# Patient Record
Sex: Female | Born: 1965 | Hispanic: Yes | Smoking: Never smoker
Health system: Southern US, Community
[De-identification: ages and names within clinical notes are randomized; demographics above are authoritative.]

## PROBLEM LIST (undated history)

## (undated) DIAGNOSIS — J45909 Unspecified asthma, uncomplicated: Secondary | ICD-10-CM

## (undated) DIAGNOSIS — Z8742 Personal history of other diseases of the female genital tract: Secondary | ICD-10-CM

## (undated) DIAGNOSIS — K219 Gastro-esophageal reflux disease without esophagitis: Secondary | ICD-10-CM

## (undated) DIAGNOSIS — K5790 Diverticulosis of intestine, part unspecified, without perforation or abscess without bleeding: Secondary | ICD-10-CM

## (undated) HISTORY — DX: Personal history of other diseases of the female genital tract: Z87.42

## (undated) HISTORY — PX: CARPAL TUNNEL RELEASE: SHX101

## (undated) HISTORY — DX: Unspecified asthma, uncomplicated: J45.909

## (undated) HISTORY — DX: Gastro-esophageal reflux disease without esophagitis: K21.9

## (undated) HISTORY — DX: Diverticulosis of intestine, part unspecified, without perforation or abscess without bleeding: K57.90

---

## 2005-03-03 ENCOUNTER — Ambulatory Visit: Payer: Self-pay | Admitting: Internal Medicine

## 2006-04-27 ENCOUNTER — Ambulatory Visit: Payer: Self-pay | Admitting: Unknown Physician Specialty

## 2007-05-14 ENCOUNTER — Ambulatory Visit: Payer: Self-pay | Admitting: Unknown Physician Specialty

## 2009-10-05 ENCOUNTER — Ambulatory Visit: Payer: Self-pay | Admitting: Unknown Physician Specialty

## 2010-08-31 ENCOUNTER — Ambulatory Visit: Payer: Self-pay | Admitting: Internal Medicine

## 2010-09-01 ENCOUNTER — Inpatient Hospital Stay: Payer: Self-pay | Admitting: Surgery

## 2010-09-29 ENCOUNTER — Ambulatory Visit: Payer: Self-pay | Admitting: Surgery

## 2011-06-22 ENCOUNTER — Ambulatory Visit: Payer: Self-pay | Admitting: General Surgery

## 2013-07-19 ENCOUNTER — Ambulatory Visit: Payer: Self-pay | Admitting: Obstetrics and Gynecology

## 2015-08-21 ENCOUNTER — Other Ambulatory Visit: Payer: Self-pay | Admitting: Obstetrics and Gynecology

## 2015-08-21 ENCOUNTER — Inpatient Hospital Stay
Admission: RE | Admit: 2015-08-21 | Discharge: 2015-08-21 | Disposition: A | Discharge status: Home / Self Care | Payer: Self-pay | Source: Ambulatory Visit | Attending: *Deleted | Admitting: *Deleted

## 2015-08-21 ENCOUNTER — Other Ambulatory Visit: Payer: Self-pay | Admitting: *Deleted

## 2015-08-21 DIAGNOSIS — Z9289 Personal history of other medical treatment: Secondary | ICD-10-CM

## 2015-08-21 DIAGNOSIS — R928 Other abnormal and inconclusive findings on diagnostic imaging of breast: Secondary | ICD-10-CM

## 2015-09-11 ENCOUNTER — Ambulatory Visit
Admission: RE | Admit: 2015-09-11 | Discharge: 2015-09-11 | Disposition: A | Discharge status: Home / Self Care | Payer: Self-pay | Source: Ambulatory Visit | Attending: Obstetrics and Gynecology | Admitting: Obstetrics and Gynecology

## 2015-09-11 ENCOUNTER — Other Ambulatory Visit: Payer: Self-pay | Admitting: Obstetrics and Gynecology

## 2015-09-11 DIAGNOSIS — R928 Other abnormal and inconclusive findings on diagnostic imaging of breast: Secondary | ICD-10-CM

## 2015-09-11 DIAGNOSIS — N6001 Solitary cyst of right breast: Secondary | ICD-10-CM | POA: Insufficient documentation

## 2015-09-11 DIAGNOSIS — N63 Unspecified lump in breast: Secondary | ICD-10-CM | POA: Insufficient documentation

## 2015-11-24 ENCOUNTER — Other Ambulatory Visit: Payer: Self-pay | Admitting: Internal Medicine

## 2015-11-24 DIAGNOSIS — R1011 Right upper quadrant pain: Secondary | ICD-10-CM

## 2015-11-24 DIAGNOSIS — R1013 Epigastric pain: Secondary | ICD-10-CM

## 2015-12-01 ENCOUNTER — Ambulatory Visit
Admission: RE | Admit: 2015-12-01 | Discharge: 2015-12-01 | Disposition: A | Discharge status: Home / Self Care | Payer: Self-pay | Source: Ambulatory Visit | Attending: Internal Medicine | Admitting: Internal Medicine

## 2015-12-01 DIAGNOSIS — R1013 Epigastric pain: Secondary | ICD-10-CM

## 2015-12-01 DIAGNOSIS — R1011 Right upper quadrant pain: Secondary | ICD-10-CM

## 2016-08-22 ENCOUNTER — Other Ambulatory Visit: Payer: Self-pay | Admitting: Obstetrics and Gynecology

## 2016-08-22 DIAGNOSIS — Z1231 Encounter for screening mammogram for malignant neoplasm of breast: Secondary | ICD-10-CM

## 2016-09-14 ENCOUNTER — Other Ambulatory Visit: Payer: Self-pay | Admitting: Obstetrics and Gynecology

## 2016-09-14 ENCOUNTER — Ambulatory Visit
Admission: RE | Admit: 2016-09-14 | Discharge: 2016-09-14 | Disposition: A | Discharge status: Home / Self Care | Payer: BC Managed Care – PPO | Source: Ambulatory Visit | Attending: Obstetrics and Gynecology | Admitting: Obstetrics and Gynecology

## 2016-09-14 DIAGNOSIS — Z1231 Encounter for screening mammogram for malignant neoplasm of breast: Secondary | ICD-10-CM | POA: Diagnosis present

## 2017-08-17 ENCOUNTER — Other Ambulatory Visit: Payer: Self-pay | Admitting: Obstetrics and Gynecology

## 2017-10-18 ENCOUNTER — Encounter: Payer: Self-pay | Admitting: Obstetrics and Gynecology

## 2017-10-18 ENCOUNTER — Ambulatory Visit (INDEPENDENT_AMBULATORY_CARE_PROVIDER_SITE_OTHER): Payer: Self-pay | Admitting: Obstetrics and Gynecology

## 2017-10-18 VITALS — BP 114/78 | Ht 59.25 in | Wt 118.0 lb

## 2017-10-18 DIAGNOSIS — Z01419 Encounter for gynecological examination (general) (routine) without abnormal findings: Secondary | ICD-10-CM

## 2017-10-18 DIAGNOSIS — Z1231 Encounter for screening mammogram for malignant neoplasm of breast: Secondary | ICD-10-CM

## 2017-10-18 DIAGNOSIS — Z1239 Encounter for other screening for malignant neoplasm of breast: Secondary | ICD-10-CM

## 2017-10-18 NOTE — Patient Instructions (Addendum)
I value your feedback and entrusting us with your care. If you get a [facility] patient survey, I would appreciate you taking the time to let us know about your experience today. Thank you!  [facility]: [phone]    

## 2017-10-18 NOTE — Progress Notes (Signed)
PCP: Mickey Farberhies, David, MD   Chief Complaint  Patient presents with  . Gynecologic Exam    HPI:      Samantha Erickson is a 51 y.o. [phone] who LMP was Patient's last menstrual period was 10/09/2017 (exact date)., presents today for her annual examination.  Her menses are regular every 28-30 days, lasting 5 days, light flow..  Dysmenorrhea mild, occurring first 1-2 days of flow. She does not have intermenstrual bleeding.  She does have occas night sweats.  Sex activity: not sexually active. She does not have vaginal dryness.  Last Pap: August 17, 2015  Results were: no abnormalities /neg HPV DNA.  Hx of STDs: none  Last mammogram: September 14, 2016  Results were: normal--routine follow-up in 12 months There is no FH of breast cancer. There is no FH of ovarian cancer. The patient does do self-breast exams.  Colonoscopy: colonoscopy 5/18 without abnormalities. Repeat due after 10 years.   Tobacco use: The patient denies current or previous tobacco use. Alcohol use: none Exercise: not active  She does get adequate calcium and Vitamin D in her diet.  Labs with PCP. Has lost 15# with diet changes/health program.  Past Medical History:  Diagnosis Date  . Asthma   . Diverticulosis   . GERD (gastroesophageal reflux disease)   . History of abnormal cervical Pap smear     Past Surgical History:  Procedure Laterality Date  . CARPAL TUNNEL RELEASE      Family History  Problem Relation Age of Onset  . Osteoporosis Mother   . Hypertension Father     Social History   Socioeconomic History  . Marital status: Widowed    Spouse name: Not on file  . Number of children: Not on file  . Years of education: Not on file  . Highest education level: Not on file  Social Needs  . Financial resource strain: Not on file  . Food insecurity - worry: Not on file  . Food insecurity - inability: Not on file  . Transportation needs - medical: Not on file  . Transportation needs -  non-medical: Not on file  Occupational History  . Not on file  Tobacco Use  . Smoking status: Never Smoker  . Smokeless tobacco: Never Used  Substance and Sexual Activity  . Alcohol use: No    Frequency: Never  . Drug use: No  . Sexual activity: Not Currently    Birth control/protection: None  Other Topics Concern  . Not on file  Social History Narrative  . Not on file    Current Meds  Medication Sig  . beclomethasone (QVAR) 80 MCG/ACT inhaler Inhale into the lungs.  . fluticasone (FLONASE) 50 MCG/ACT nasal spray Place into the nose.  . loratadine (CLARITIN) 10 MG tablet Take by mouth.  Marland Kitchen. omeprazole (PRILOSEC) 20 MG capsule Take by mouth.      ROS:  Review of Systems  Constitutional: Negative for fatigue, fever and unexpected weight change.  Respiratory: Negative for cough, shortness of breath and wheezing.   Cardiovascular: Negative for chest pain, palpitations and leg swelling.  Gastrointestinal: Negative for blood in stool, constipation, diarrhea, nausea and vomiting.  Endocrine: Negative for cold intolerance, heat intolerance and polyuria.  Genitourinary: Negative for dyspareunia, dysuria, flank pain, frequency, genital sores, hematuria, menstrual problem, pelvic pain, urgency, vaginal bleeding, vaginal discharge and vaginal pain.  Musculoskeletal: Negative for back pain, joint swelling and myalgias.  Skin: Negative for rash.  Neurological: Negative for dizziness, syncope, light-headedness, numbness  and headaches.  Hematological: Negative for adenopathy.  Psychiatric/Behavioral: Negative for agitation, confusion, sleep disturbance and suicidal ideas. The patient is not nervous/anxious.      Objective: BP 114/78   Ht 4' 11.25" (1.505 m)   Wt 118 lb (53.5 kg)   LMP 10/09/2017 (Exact Date)   BMI 23.63 kg/m    Physical Exam  Constitutional: She is oriented to person, place, and time. She appears well-developed and well-nourished.  Genitourinary: Vagina normal  and uterus normal. There is no rash or tenderness on the right labia. There is no rash or tenderness on the left labia. No erythema or tenderness in the vagina. No vaginal discharge found. Right adnexum does not display mass and does not display tenderness. Left adnexum does not display mass and does not display tenderness. Cervix does not exhibit motion tenderness or polyp. Uterus is not enlarged or tender.  Neck: Normal range of motion. No thyromegaly present.  Cardiovascular: Normal rate, regular rhythm and normal heart sounds.  No murmur heard. Pulmonary/Chest: Effort normal and breath sounds normal. Right breast exhibits no mass, no nipple discharge, no skin change and no tenderness. Left breast exhibits no mass, no nipple discharge, no skin change and no tenderness.  Abdominal: Soft. There is no tenderness. There is no guarding.  Musculoskeletal: Normal range of motion.  Neurological: She is alert and oriented to person, place, and time. No cranial nerve deficit.  Psychiatric: She has a normal mood and affect. Her behavior is normal.  Vitals reviewed.   Assessment/Plan:  Encounter for annual routine gynecological examination  Screening for breast cancer - Pt to sched mammo - Plan: MM DIGITAL SCREENING BILATERAL        GYN counsel breast self exam, mammography screening, menopause, adequate intake of calcium and vitamin D, diet and exercise    F/U  Return in about 1 year (around 10/18/2018).  [name] B. Arbell Wycoff, PA-C 10/18/2017 10:54 AM

## 2017-11-08 ENCOUNTER — Ambulatory Visit
Admission: RE | Admit: 2017-11-08 | Discharge: 2017-11-08 | Disposition: A | Discharge status: Home / Self Care | Payer: Self-pay | Source: Ambulatory Visit | Attending: Obstetrics and Gynecology | Admitting: Obstetrics and Gynecology

## 2017-11-08 ENCOUNTER — Encounter: Payer: Self-pay | Admitting: Obstetrics and Gynecology

## 2017-11-08 DIAGNOSIS — Z1231 Encounter for screening mammogram for malignant neoplasm of breast: Secondary | ICD-10-CM | POA: Insufficient documentation

## 2017-11-08 DIAGNOSIS — Z1239 Encounter for other screening for malignant neoplasm of breast: Secondary | ICD-10-CM

## 2018-10-30 ENCOUNTER — Ambulatory Visit (INDEPENDENT_AMBULATORY_CARE_PROVIDER_SITE_OTHER): Payer: Self-pay | Admitting: Obstetrics and Gynecology

## 2018-10-30 ENCOUNTER — Other Ambulatory Visit (HOSPITAL_COMMUNITY)
Admission: RE | Admit: 2018-10-30 | Discharge: 2018-10-30 | Disposition: A | Discharge status: Home / Self Care | Payer: Self-pay | Source: Ambulatory Visit | Attending: Obstetrics and Gynecology | Admitting: Obstetrics and Gynecology

## 2018-10-30 ENCOUNTER — Encounter: Payer: Self-pay | Admitting: Obstetrics and Gynecology

## 2018-10-30 VITALS — BP 100/70 | Ht 59.0 in | Wt 114.0 lb

## 2018-10-30 DIAGNOSIS — Z1239 Encounter for other screening for malignant neoplasm of breast: Secondary | ICD-10-CM

## 2018-10-30 DIAGNOSIS — Z124 Encounter for screening for malignant neoplasm of cervix: Secondary | ICD-10-CM

## 2018-10-30 DIAGNOSIS — Z01419 Encounter for gynecological examination (general) (routine) without abnormal findings: Secondary | ICD-10-CM

## 2018-10-30 DIAGNOSIS — Z1151 Encounter for screening for human papillomavirus (HPV): Secondary | ICD-10-CM

## 2018-10-30 NOTE — Patient Instructions (Signed)
I value your feedback and entrusting us with your care. If you get a [facility] patient survey, I would appreciate you taking the time to let us know about your experience today. Thank you! 

## 2018-10-30 NOTE — Progress Notes (Signed)
PCP: Mickey Farber, MD   Chief Complaint  Patient presents with  . Gynecologic Exam    HPI:      Ms. Samantha Erickson is a 53 y.o. [phone] who LMP was Patient's last menstrual period was 10/01/2018 (approximate)., presents today for her annual examination.  Her menses are regular every 28-30 days, lasting 4 days, light flow.  Dysmenorrhea mild, occurring first 1-2 days of flow. She does not have intermenstrual bleeding.  She does not have vasomotor sx.  Sex activity: not sexually active. She does not have vaginal dryness.  Last Pap: August 17, 2015  Results were: no abnormalities /neg HPV DNA.  Hx of STDs: none  Last mammogram: 11/08/17  Results were: normal--routine follow-up in 12 months There is no FH of breast cancer. There is no FH of ovarian cancer. The patient does do self-breast exams.  Colonoscopy: colonoscopy 5/18 without abnormalities. Repeat due after 10 years.   Tobacco use: The patient denies current or previous tobacco use. Alcohol use: none Exercise: not active  She does get adequate calcium and Vitamin D in her diet.  Labs with PCP.   Past Medical History:  Diagnosis Date  . Asthma   . Diverticulosis   . GERD (gastroesophageal reflux disease)   . History of abnormal cervical Pap smear     Past Surgical History:  Procedure Laterality Date  . CARPAL TUNNEL RELEASE      Family History  Problem Relation Age of Onset  . Osteoporosis Mother   . Hypertension Father   . Breast cancer Neg Hx     Social History   Socioeconomic History  . Marital status: Widowed    Spouse name: Not on file  . Number of children: Not on file  . Years of education: Not on file  . Highest education level: Not on file  Occupational History  . Not on file  Social Needs  . Financial resource strain: Not on file  . Food insecurity:    Worry: Not on file    Inability: Not on file  . Transportation needs:    Medical: Not on file    Non-medical: Not on file  Tobacco  Use  . Smoking status: Never Smoker  . Smokeless tobacco: Never Used  Substance and Sexual Activity  . Alcohol use: No    Frequency: Never  . Drug use: No  . Sexual activity: Not Currently    Birth control/protection: None  Lifestyle  . Physical activity:    Days per week: 0 days    Minutes per session: 0 min  . Stress: Not at all  Relationships  . Social connections:    Talks on phone: More than three times a week    Gets together: Twice a week    Attends religious service: More than 4 times per year    Active member of club or organization: No    Attends meetings of clubs or organizations: Never    Relationship status: Widowed  . Intimate partner violence:    Fear of current or ex partner: No    Emotionally abused: No    Physically abused: No    Forced sexual activity: No  Other Topics Concern  . Not on file  Social History Narrative  . Not on file    Current Meds  Medication Sig  . beclomethasone (QVAR) 80 MCG/ACT inhaler Inhale into the lungs.  . fluticasone (FLONASE) 50 MCG/ACT nasal spray Place into the nose.  . loratadine (CLARITIN) 10 MG  tablet Take by mouth.  . MULTIPLE VITAMIN PO Take by mouth.  Marland Kitchen omeprazole (PRILOSEC) 20 MG capsule Take by mouth.  . Probiotic Product (ACIDOPHILUS PROBIOTIC BLEND PO) Take by mouth.  Marland Kitchen UNABLE TO FIND Take by mouth.      ROS:  Review of Systems  Constitutional: Negative for fatigue, fever and unexpected weight change.  Respiratory: Negative for cough, shortness of breath and wheezing.   Cardiovascular: Negative for chest pain, palpitations and leg swelling.  Gastrointestinal: Negative for blood in stool, constipation, diarrhea, nausea and vomiting.  Endocrine: Negative for cold intolerance, heat intolerance and polyuria.  Genitourinary: Negative for dyspareunia, dysuria, flank pain, frequency, genital sores, hematuria, menstrual problem, pelvic pain, urgency, vaginal bleeding, vaginal discharge and vaginal pain.    Musculoskeletal: Negative for back pain, joint swelling and myalgias.  Skin: Negative for rash.  Neurological: Negative for dizziness, syncope, light-headedness, numbness and headaches.  Hematological: Negative for adenopathy.  Psychiatric/Behavioral: Negative for agitation, confusion, sleep disturbance and suicidal ideas. The patient is not nervous/anxious.      Objective: BP 100/70   Ht 4\' 11"  (1.499 m)   Wt 114 lb (51.7 kg)   LMP 10/01/2018 (Approximate)   BMI 23.03 kg/m    Physical Exam Constitutional:      Appearance: She is well-developed.  Genitourinary:     Vulva, vagina, uterus, right adnexa and left adnexa normal.     No vulval lesion or tenderness noted.     No vaginal discharge, erythema or tenderness.     No cervical motion tenderness or polyp.     Uterus is not enlarged or tender.     No right or left adnexal mass present.     Right adnexa not tender.     Left adnexa not tender.  Neck:     Musculoskeletal: Normal range of motion.     Thyroid: No thyromegaly.  Cardiovascular:     Rate and Rhythm: Normal rate and regular rhythm.     Heart sounds: Normal heart sounds. No murmur.  Pulmonary:     Effort: Pulmonary effort is normal.     Breath sounds: Normal breath sounds.  Chest:     Breasts:        Right: No mass, nipple discharge, skin change or tenderness.        Left: No mass, nipple discharge, skin change or tenderness.  Abdominal:     Palpations: Abdomen is soft.     Tenderness: There is no abdominal tenderness. There is no guarding.  Musculoskeletal: Normal range of motion.  Neurological:     Mental Status: She is alert and oriented to person, place, and time.     Cranial Nerves: No cranial nerve deficit.  Psychiatric:        Behavior: Behavior normal.  Vitals signs reviewed.     Assessment/Plan:  Encounter for annual routine gynecological examination  Cervical cancer screening - Plan: Cytology - PAP  Screening for HPV (human  papillomavirus) - Plan: Cytology - PAP  Screening for breast cancer - Pt to sched mammo - Plan: MM 3D SCREEN BREAST BILATERAL        GYN counsel breast self exam, mammography screening, menopause, adequate intake of calcium and vitamin D, diet and exercise    F/U  Return in about 1 year (around 10/31/2019).  Alicia B. Copland, PA-C 10/30/2018 10:48 AM

## 2018-11-01 LAB — CYTOLOGY - PAP
Diagnosis: NEGATIVE
HPV (WINDOPATH): NOT DETECTED

## 2020-03-19 ENCOUNTER — Other Ambulatory Visit: Payer: Self-pay | Admitting: Pediatrics

## 2020-03-19 DIAGNOSIS — Z1231 Encounter for screening mammogram for malignant neoplasm of breast: Secondary | ICD-10-CM

## 2020-03-24 ENCOUNTER — Encounter (INDEPENDENT_AMBULATORY_CARE_PROVIDER_SITE_OTHER): Payer: Self-pay

## 2020-03-24 ENCOUNTER — Ambulatory Visit
Admission: RE | Admit: 2020-03-24 | Discharge: 2020-03-24 | Disposition: A | Discharge status: Home / Self Care | Payer: Self-pay | Source: Ambulatory Visit | Attending: Pediatrics | Admitting: Pediatrics

## 2020-03-24 ENCOUNTER — Other Ambulatory Visit: Payer: Self-pay

## 2020-03-24 DIAGNOSIS — Z1231 Encounter for screening mammogram for malignant neoplasm of breast: Secondary | ICD-10-CM | POA: Insufficient documentation

## 2020-03-26 ENCOUNTER — Other Ambulatory Visit: Payer: Self-pay | Admitting: Pediatrics

## 2020-03-26 DIAGNOSIS — R928 Other abnormal and inconclusive findings on diagnostic imaging of breast: Secondary | ICD-10-CM

## 2020-03-26 DIAGNOSIS — N6320 Unspecified lump in the left breast, unspecified quadrant: Secondary | ICD-10-CM

## 2020-03-26 DIAGNOSIS — N6310 Unspecified lump in the right breast, unspecified quadrant: Secondary | ICD-10-CM

## 2020-04-01 ENCOUNTER — Ambulatory Visit
Admission: RE | Admit: 2020-04-01 | Discharge: 2020-04-01 | Disposition: A | Discharge status: Home / Self Care | Payer: Self-pay | Source: Ambulatory Visit | Attending: Pediatrics | Admitting: Pediatrics

## 2020-04-01 DIAGNOSIS — N6310 Unspecified lump in the right breast, unspecified quadrant: Secondary | ICD-10-CM

## 2020-04-01 DIAGNOSIS — N6320 Unspecified lump in the left breast, unspecified quadrant: Secondary | ICD-10-CM

## 2020-04-01 DIAGNOSIS — R928 Other abnormal and inconclusive findings on diagnostic imaging of breast: Secondary | ICD-10-CM

## 2020-07-07 ENCOUNTER — Telehealth: Payer: Self-pay

## 2020-07-07 NOTE — Telephone Encounter (Signed)
Spoke w/patient. Inquired about pain. She reports the pain lasts about 2 days. She is able to control it with Ibuprofen. She states it makes her feel really bad and gives her problems with her stomach (bloated). She has acid reflux. She has discussed with her primary. She had colonoscopy and endoscopy. Primary recommended her to mention to GYN at her next AE to order u/s to see what may be causing this. Her bleeding is normal (not heavy/irregular)

## 2020-07-07 NOTE — Telephone Encounter (Signed)
Patient requesting [facility] to order an u/s for her. She has been having heavy lower abdominal pain (in the area of ovaries and uterus) before and after her period. Cb#[phone]

## 2020-07-07 NOTE — Telephone Encounter (Signed)
Pt aware, transferred to [facility] to schedule appt.

## 2020-07-07 NOTE — Telephone Encounter (Signed)
Haven't seen pt since 1/20. She needs appt first and can then sched u/s.

## 2020-07-08 NOTE — Progress Notes (Signed)
Orene Desanctis, MD   Chief Complaint  Patient presents with  . Pelvic Pain    before and after cycle, entire pelvic area x 1 yr    HPI:      Ms. Samantha Erickson is a 54 y.o. [phone] whose LMP was Patient's last menstrual period was 06/28/2020 (approximate)., presents today for pelvic pain/dysmen before and after her menses, for the past yr. Menses are monthly, last 3-4 days, light flow, no BTB. Has dysmen, as well as fatigue/lethargy and bloating with LBP 5-6 days before her period and for 1 day during her period. Then has some cramping a few days after her period ends. Takes tylenol/NSAIDs with some improvement of cramps. No urin or vag sx. Has some loose stools sometimes with menses, but also keeping stools soft due to hx of diverticulosis. Pt's mom and 2 sisters s/p hyst. Has 1 sister who is 83 and still having periods. No vasomotor sx. Pt is not sex active. No non-menstrual cramping/pain.   She has acid reflux and has discussed dysmen with her primary. She had colonoscopy and endoscopy. Primary recommended her to mention to GYN at her next AE to order u/s.  Last pap neg/neg HPV DNA 10/30/18  PT IS SELF PAY  Past Medical History:  Diagnosis Date  . Asthma   . Diverticulosis   . GERD (gastroesophageal reflux disease)   . History of abnormal cervical Pap smear     Past Surgical History:  Procedure Laterality Date  . CARPAL TUNNEL RELEASE      Family History  Problem Relation Age of Onset  . Osteoporosis Mother   . Hypertension Father   . Breast cancer Maternal Aunt 1    Social History   Socioeconomic History  . Marital status: Widowed    Spouse name: Not on file  . Number of children: Not on file  . Years of education: Not on file  . Highest education level: Not on file  Occupational History  . Not on file  Tobacco Use  . Smoking status: Never Smoker  . Smokeless tobacco: Never Used  Vaping Use  . Vaping Use: Never used  Substance and Sexual Activity  .  Alcohol use: No  . Drug use: No  . Sexual activity: Not Currently    Birth control/protection: None  Other Topics Concern  . Not on file  Social History Narrative  . Not on file   Social Determinants of Health   Financial Resource Strain:   . Difficulty of Paying Living Expenses: Not on file  Food Insecurity:   . Worried About Programme researcher, broadcasting/film/video in the Last Year: Not on file  . Ran Out of Food in the Last Year: Not on file  Transportation Needs:   . Lack of Transportation (Medical): Not on file  . Lack of Transportation (Non-Medical): Not on file  Physical Activity:   . Days of Exercise per Week: Not on file  . Minutes of Exercise per Session: Not on file  Stress:   . Feeling of Stress : Not on file  Social Connections:   . Frequency of Communication with Friends and Family: Not on file  . Frequency of Social Gatherings with Friends and Family: Not on file  . Attends Religious Services: Not on file  . Active Member of Clubs or Organizations: Not on file  . Attends Banker Meetings: Not on file  . Marital Status: Not on file  Intimate Partner Violence:   . Fear  of Current or Ex-Partner: Not on file  . Emotionally Abused: Not on file  . Physically Abused: Not on file  . Sexually Abused: Not on file    Outpatient Medications Prior to Visit  Medication Sig Dispense Refill  . albuterol (VENTOLIN HFA) 108 (90 Base) MCG/ACT inhaler Inhale into the lungs.    . beclomethasone (QVAR) 80 MCG/ACT inhaler Inhale into the lungs.    . Cholecalciferol 50 MCG (2000 UT) TABS Take by mouth.    . fluticasone (FLONASE) 50 MCG/ACT nasal spray Place into the nose.    . loratadine (CLARITIN) 10 MG tablet Take by mouth.    . MULTIPLE VITAMIN PO Take by mouth.    Marland Kitchen omeprazole (PRILOSEC) 40 MG capsule Take by mouth.    . Probiotic Product (ACIDOPHILUS PROBIOTIC BLEND PO) Take by mouth.    Marland Kitchen omeprazole (PRILOSEC) 20 MG capsule Take by mouth.    Marland Kitchen UNABLE TO FIND Take by mouth.      No facility-administered medications prior to visit.      ROS:  Review of Systems  Constitutional: Negative for fever.  Gastrointestinal: Negative for blood in stool, constipation, diarrhea, nausea and vomiting.  Genitourinary: Positive for pelvic pain. Negative for dyspareunia, dysuria, flank pain, frequency, hematuria, urgency, vaginal bleeding, vaginal discharge and vaginal pain.  Musculoskeletal: Negative for back pain.  Skin: Negative for rash.   BREAST: No symptoms   OBJECTIVE:   Vitals:  BP 100/70   Ht 4\' 11"  (1.499 m)   Wt 121 lb (54.9 kg)   LMP 06/28/2020 (Approximate)   BMI 24.44 kg/m   Physical Exam Vitals reviewed.  Constitutional:      Appearance: She is well-developed.  Pulmonary:     Effort: Pulmonary effort is normal.  Abdominal:     Palpations: Abdomen is soft.     Tenderness: There is no abdominal tenderness. There is no guarding or rebound.  Genitourinary:    General: Normal vulva.     Pubic Area: No rash.      Labia:        Right: No rash, tenderness or lesion.        Left: No rash, tenderness or lesion.      Vagina: Normal. No vaginal discharge, erythema or tenderness.     Cervix: Normal.     Uterus: Normal. Not enlarged and not tender.      Adnexa: Right adnexa normal and left adnexa normal.       Right: No mass or tenderness.         Left: No mass or tenderness.    Musculoskeletal:        General: Normal range of motion.     Cervical back: Normal range of motion.  Skin:    General: Skin is warm and dry.  Neurological:     General: No focal deficit present.     Mental Status: She is alert and oriented to person, place, and time.  Psychiatric:        Mood and Affect: Mood normal.        Behavior: Behavior normal.        Thought Content: Thought content normal.        Judgment: Judgment normal.     Assessment/Plan: Pelvic pain - Plan: 08/28/2020 PELVIS TRANSVAGINAL NON-OB (TV ONLY); Neg exam. Check GYN u/s. Will f/u with results. If  neg, reassurance re: normal cramping with menses. Can also try heating pad. F/u prn AUB.  Dysmenorrhea - Plan: US PELVIS TRANSVAGINAL  NON-OB (TV ONLY); discussed normal fatigue/letharge, bloating before menses, particularly due to hormonal changes.      Return in about 1 day (around 07/10/2020) for GYN u/s for pelvic pain--[facility] to call.  Dream Nodal B. Korby Ratay, PA-C 07/09/2020 10:57 AM

## 2020-07-09 ENCOUNTER — Ambulatory Visit (INDEPENDENT_AMBULATORY_CARE_PROVIDER_SITE_OTHER): Payer: Self-pay | Admitting: Obstetrics and Gynecology

## 2020-07-09 ENCOUNTER — Other Ambulatory Visit: Payer: Self-pay

## 2020-07-09 ENCOUNTER — Encounter: Payer: Self-pay | Admitting: Obstetrics and Gynecology

## 2020-07-09 VITALS — BP 100/70 | Ht 59.0 in | Wt 121.0 lb

## 2020-07-09 DIAGNOSIS — R102 Pelvic and perineal pain: Secondary | ICD-10-CM

## 2020-07-09 DIAGNOSIS — N946 Dysmenorrhea, unspecified: Secondary | ICD-10-CM

## 2020-07-09 NOTE — Patient Instructions (Signed)
I value your feedback and entrusting us with your care. If you get a [facility] patient survey, I would appreciate you taking the time to let us know about your experience today. Thank you!  As of October 03, 2019, your lab results will be released to your MyChart immediately, before I even have a chance to see them. Please give me time to review them and contact you if there are any abnormalities. Thank you for your patience.  

## 2020-07-13 ENCOUNTER — Other Ambulatory Visit: Payer: Self-pay

## 2020-07-13 ENCOUNTER — Ambulatory Visit (INDEPENDENT_AMBULATORY_CARE_PROVIDER_SITE_OTHER): Payer: Self-pay

## 2020-07-13 ENCOUNTER — Telehealth: Payer: Self-pay | Admitting: Obstetrics and Gynecology

## 2020-07-13 DIAGNOSIS — N946 Dysmenorrhea, unspecified: Secondary | ICD-10-CM

## 2020-07-13 DIAGNOSIS — R102 Pelvic and perineal pain: Secondary | ICD-10-CM

## 2020-07-13 NOTE — Telephone Encounter (Signed)
LM for pt with normal GYN u/s results. Reassurance re: dysmen with menses. F/u prn.

## 2021-07-06 ENCOUNTER — Other Ambulatory Visit: Payer: Self-pay | Admitting: Pediatrics

## 2021-08-06 ENCOUNTER — Other Ambulatory Visit: Payer: Self-pay | Admitting: Pediatrics

## 2021-08-06 DIAGNOSIS — Z1231 Encounter for screening mammogram for malignant neoplasm of breast: Secondary | ICD-10-CM

## 2021-08-12 ENCOUNTER — Other Ambulatory Visit: Payer: Self-pay | Admitting: Pediatrics

## 2021-08-12 ENCOUNTER — Other Ambulatory Visit (HOSPITAL_COMMUNITY): Payer: Self-pay | Admitting: Pediatrics

## 2021-08-12 DIAGNOSIS — R1032 Left lower quadrant pain: Secondary | ICD-10-CM

## 2021-08-30 ENCOUNTER — Other Ambulatory Visit: Payer: Self-pay

## 2021-08-30 ENCOUNTER — Ambulatory Visit
Admission: RE | Admit: 2021-08-30 | Discharge: 2021-08-30 | Disposition: A | Discharge status: Home / Self Care | Payer: Self-pay | Source: Ambulatory Visit | Attending: Pediatrics | Admitting: Pediatrics

## 2021-08-30 DIAGNOSIS — Z1231 Encounter for screening mammogram for malignant neoplasm of breast: Secondary | ICD-10-CM | POA: Insufficient documentation

## 2021-08-31 ENCOUNTER — Ambulatory Visit
Admission: RE | Admit: 2021-08-31 | Discharge: 2021-08-31 | Disposition: A | Discharge status: Home / Self Care | Payer: Self-pay | Source: Ambulatory Visit | Attending: Pediatrics | Admitting: Pediatrics

## 2021-08-31 DIAGNOSIS — R1032 Left lower quadrant pain: Secondary | ICD-10-CM | POA: Insufficient documentation

## 2021-08-31 MED ORDER — IOHEXOL 300 MG/ML  SOLN
100.0000 mL | Freq: Once | INTRAMUSCULAR | Status: AC | PRN
  Administered 2021-08-31: 100 mL via INTRAVENOUS

## 2021-09-02 ENCOUNTER — Other Ambulatory Visit: Payer: Self-pay | Admitting: Pediatrics

## 2021-09-02 DIAGNOSIS — N6489 Other specified disorders of breast: Secondary | ICD-10-CM

## 2021-09-02 DIAGNOSIS — R928 Other abnormal and inconclusive findings on diagnostic imaging of breast: Secondary | ICD-10-CM

## 2021-09-15 ENCOUNTER — Ambulatory Visit
Admission: RE | Admit: 2021-09-15 | Discharge: 2021-09-15 | Disposition: A | Discharge status: Home / Self Care | Payer: Self-pay | Source: Ambulatory Visit | Attending: Pediatrics | Admitting: Pediatrics

## 2021-09-15 ENCOUNTER — Other Ambulatory Visit: Payer: Self-pay

## 2021-09-15 DIAGNOSIS — R928 Other abnormal and inconclusive findings on diagnostic imaging of breast: Secondary | ICD-10-CM | POA: Insufficient documentation

## 2021-09-15 DIAGNOSIS — N6489 Other specified disorders of breast: Secondary | ICD-10-CM

## 2022-02-01 ENCOUNTER — Other Ambulatory Visit: Payer: Self-pay | Admitting: Pediatrics

## 2022-02-01 DIAGNOSIS — N6489 Other specified disorders of breast: Secondary | ICD-10-CM

## 2022-02-21 ENCOUNTER — Ambulatory Visit
Admission: RE | Admit: 2022-02-21 | Discharge: 2022-02-21 | Disposition: A | Discharge status: Home / Self Care | Payer: Self-pay | Source: Ambulatory Visit | Attending: Pediatrics | Admitting: Pediatrics

## 2022-02-21 DIAGNOSIS — N6489 Other specified disorders of breast: Secondary | ICD-10-CM | POA: Insufficient documentation

## 2022-06-06 ENCOUNTER — Ambulatory Visit (INDEPENDENT_AMBULATORY_CARE_PROVIDER_SITE_OTHER): Payer: Self-pay | Admitting: Obstetrics & Gynecology

## 2022-06-06 ENCOUNTER — Other Ambulatory Visit (HOSPITAL_COMMUNITY)
Admission: RE | Admit: 2022-06-06 | Discharge: 2022-06-06 | Disposition: A | Discharge status: Home / Self Care | Payer: Self-pay | Source: Ambulatory Visit | Attending: Obstetrics & Gynecology | Admitting: Obstetrics & Gynecology

## 2022-06-06 ENCOUNTER — Encounter: Payer: Self-pay | Admitting: Obstetrics & Gynecology

## 2022-06-06 VITALS — BP 120/80 | Ht 59.0 in | Wt 119.0 lb

## 2022-06-06 DIAGNOSIS — N9089 Other specified noninflammatory disorders of vulva and perineum: Secondary | ICD-10-CM

## 2022-06-06 DIAGNOSIS — N6321 Unspecified lump in the left breast, upper outer quadrant: Secondary | ICD-10-CM

## 2022-06-06 DIAGNOSIS — Z124 Encounter for screening for malignant neoplasm of cervix: Secondary | ICD-10-CM | POA: Insufficient documentation

## 2022-06-06 DIAGNOSIS — Z803 Family history of malignant neoplasm of breast: Secondary | ICD-10-CM

## 2022-06-06 DIAGNOSIS — Z01419 Encounter for gynecological examination (general) (routine) without abnormal findings: Secondary | ICD-10-CM

## 2022-06-06 DIAGNOSIS — N6320 Unspecified lump in the left breast, unspecified quadrant: Secondary | ICD-10-CM | POA: Insufficient documentation

## 2022-06-06 DIAGNOSIS — N926 Irregular menstruation, unspecified: Secondary | ICD-10-CM

## 2022-06-06 NOTE — Progress Notes (Signed)
Subjective:    Samantha Erickson is a 56 y.o. widowed Hispanic P3 (who presents for an annual exam. She was last seen here in 2021. She reports that her periods have been coming every 2 weeks for the last few months. She has an app and has been tracking them for several months.The patient is not currently sexually active. GYN screening history: last pap: was normal. The patient wears seatbelts: yes. The patient participates in regular exercise: yes. (Walking) Has the patient ever been transfused or tattooed?: no. The patient reports that there is not domestic violence in her life.   Menstrual History: OB History     Gravida  4   Para  3   Term      Preterm      AB  1   Living  3      SAB  1   IAB      Ectopic      Multiple      Live Births  3            Patient's last menstrual period was 05/15/2022. Period Pattern: (!) Irregular  The following portions of the patient's history were reviewed and updated as appropriate: allergies, current medications, past family history, past medical history, past social history, past surgical history, and problem list.  Review of Systems Pertinent items are noted in HPI.  FH- breast cancer in her mom and maternal aunt. She denies FH of gyn or colon cancers. She is being followed with short term imaging for a breast lump in left breast. Her mom went through menopause around 57 years old. She was born in the [place]. 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   Objective:    BP 120/80   Ht '4\' 11"'  (1.499 m)   Wt 119 lb (54 kg)   LMP 05/15/2022   BMI 24.04 kg/m   General Appearance:    Alert, cooperative, no distress, appears stated age  Head:    Normocephalic, without obvious abnormality, atraumatic  Eyes:    PERRL, conjunctiva/corneas clear, EOM's intact, fundi    benign, both eyes  Ears:    Normal TM's and external ear canals, both ears  Nose:   Nares normal, septum midline, mucosa normal, no drainage    or sinus  tenderness  Throat:   Lips, mucosa, and tongue normal; teeth and gums normal  Neck:   Supple, symmetrical, trachea midline, no adenopathy;    thyroid:  no enlargement/tenderness/nodules; no carotid   bruit or JVD  Back:     Symmetric, no curvature, ROM normal, no CVA tenderness  Lungs:     Clear to auscultation bilaterally, respirations unlabored  Chest Wall:    No tenderness or deformity   Heart:    Regular rate and rhythm, S1 and S2 normal, no murmur, rub   or gallop  Breast Exam:    3 cm mobile distinct left breast mass in the upper outer quadrant  Abdomen:     Soft, non-tender, bowel sounds active all four quadrants,    no masses, no organomegaly  Genitalia:    Some brown discoloration of the vulva, not necessarily abnormal but there is a small area in the left posterior fourchette that is more reddened (see photo under media) Normal size and shape, retroverted uterus, normal adnexal exam     Extremities:   Extremities normal, atraumatic, no cyanosis or edema  Pulses:   2+ and symmetric all extremities  Skin:  Skin color, texture, turgor normal, no rashes or lesions  Lymph nodes:   Cervical, supraclavicular, and axillary nodes normal  Neurologic:   CNII-XII intact, normal strength, sensation and reflexes    throughout  .    Assessment:    Healthy female exam.  Irregular (perimenopausal) bleeding/periods Breast cancer in her mom and maternal aunt Left breast mass Vulvar skin changes   Plan:     Await pap smear results.  Check TSH, T4, and gyn ultrasound [facility] testing ordered Gen surg consult regarding breast Come back for vulvar biopsy

## 2022-06-08 LAB — CYTOLOGY - PAP
Comment: NEGATIVE
Diagnosis: UNDETERMINED — AB
High risk HPV: NEGATIVE

## 2022-06-11 ENCOUNTER — Encounter: Payer: Self-pay | Admitting: Obstetrics & Gynecology

## 2022-06-16 ENCOUNTER — Ambulatory Visit
Admission: RE | Admit: 2022-06-16 | Discharge: 2022-06-16 | Disposition: A | Discharge status: Home / Self Care | Payer: Self-pay | Source: Ambulatory Visit | Attending: Obstetrics & Gynecology | Admitting: Obstetrics & Gynecology

## 2022-06-16 ENCOUNTER — Telehealth: Payer: Self-pay

## 2022-06-16 DIAGNOSIS — N926 Irregular menstruation, unspecified: Secondary | ICD-10-CM | POA: Insufficient documentation

## 2022-06-16 NOTE — Telephone Encounter (Signed)
Pt called needing a referral for [facility] for a left breast biopsy. Pt wants to see Dr Manson Passey. She states that Dr Marice Potter sent her to a surgeon but the surgeon cancelled the appointment because she will need to bx first.

## 2022-06-17 ENCOUNTER — Ambulatory Visit: Payer: Self-pay | Admitting: Surgery

## 2022-06-20 ENCOUNTER — Ambulatory Visit (INDEPENDENT_AMBULATORY_CARE_PROVIDER_SITE_OTHER): Payer: Self-pay | Admitting: Obstetrics & Gynecology

## 2022-06-20 ENCOUNTER — Encounter: Payer: Self-pay | Admitting: Obstetrics & Gynecology

## 2022-06-20 ENCOUNTER — Other Ambulatory Visit (HOSPITAL_COMMUNITY)
Admission: RE | Admit: 2022-06-20 | Discharge: 2022-06-20 | Disposition: A | Discharge status: Home / Self Care | Payer: Self-pay | Source: Ambulatory Visit | Attending: Obstetrics & Gynecology | Admitting: Obstetrics & Gynecology

## 2022-06-20 ENCOUNTER — Other Ambulatory Visit (HOSPITAL_COMMUNITY)
Admission: RE | Admit: 2022-06-20 | Discharge: 2022-06-20 | Disposition: A | Discharge status: Home / Self Care | Payer: Self-pay | Source: Ambulatory Visit

## 2022-06-20 VITALS — BP 116/84 | Ht 59.0 in | Wt 117.6 lb

## 2022-06-20 DIAGNOSIS — N9089 Other specified noninflammatory disorders of vulva and perineum: Secondary | ICD-10-CM

## 2022-06-20 DIAGNOSIS — N926 Irregular menstruation, unspecified: Secondary | ICD-10-CM

## 2022-06-20 DIAGNOSIS — N898 Other specified noninflammatory disorders of vagina: Secondary | ICD-10-CM

## 2022-06-20 DIAGNOSIS — N9489 Other specified conditions associated with female genital organs and menstrual cycle: Secondary | ICD-10-CM | POA: Insufficient documentation

## 2022-06-20 NOTE — Addendum Note (Signed)
Addended by: Nicholaus Bloom C on: 06/20/2022 10:30 AM   Modules accepted: Orders

## 2022-06-20 NOTE — Progress Notes (Addendum)
   Subjective:    Patient ID: Samantha Erickson, female    DOB: 1966-07-13, 56 y.o.   MRN: [id number]  HPI 55 yo widowed P3 here for [facility] and vulvar biopsies. She was seen 06/06/2022 with irregular periods. An ultrasound showed a 6 mm endometrium and a possible echogenic mass.  During the exam, I saw an area of irregular redness of her vulva.  She is abstinent.  Today she mentions a vaginal itching for the last few days.   Review of Systems Her pap showed ASCUS with negative HPV 06/16/2022    Objective:   Physical Exam Well nourished, well hydrated Hispanic female, no apparent distress She is ambulating and conversing normally. The reddened area of her posterior fourchette is no longer red, so she does not need the vuvlar biopsy    UPT negative, consent signed, time out done Cervix prepped with betadine and sprayed with Hurricaine spray. I then grasped with a single tooth tenaculum. Uterus sounded to 9 cm Pipelle used for 2 passes with a very large amount of tissue obtained. She tolerated the procedure well.       Assessment & Plan:  1) Left breast mass- she is being referred for a biopsy  2) Vaginal itching- Aptima testing sent  3) Possible endometrial mass- await EMBX results  4) ASCUS pap with negative HR HPV- repeat pap in 1 year  I will notify her of the pathology results via Mychart.  5) Irregular bleeding at 56 years of age-  check FSH and TSH

## 2022-06-21 ENCOUNTER — Encounter: Payer: Self-pay | Admitting: Obstetrics & Gynecology

## 2022-06-21 ENCOUNTER — Other Ambulatory Visit (INDEPENDENT_AMBULATORY_CARE_PROVIDER_SITE_OTHER): Payer: Self-pay | Admitting: Obstetrics & Gynecology

## 2022-06-21 LAB — FOLLICLE STIMULATING HORMONE: FSH: 13.5 m[IU]/mL

## 2022-06-21 LAB — SURGICAL PATHOLOGY

## 2022-06-21 MED ORDER — NORETHINDRONE 0.35 MG PO TABS: 1.0000 | ORAL_TABLET | Freq: Every day | ORAL | 5 refills | Status: AC

## 2022-06-21 NOTE — Progress Notes (Signed)
Micronor prescribed to try to fix her DUB.

## 2022-06-22 LAB — CERVICOVAGINAL ANCILLARY ONLY
Bacterial Vaginitis (gardnerella): NEGATIVE
Candida Glabrata: NEGATIVE
Candida Vaginitis: NEGATIVE
Comment: NEGATIVE
Comment: NEGATIVE
Comment: NEGATIVE

## 2022-07-06 ENCOUNTER — Other Ambulatory Visit: Payer: Self-pay | Admitting: Pediatrics

## 2022-07-06 DIAGNOSIS — N6320 Unspecified lump in the left breast, unspecified quadrant: Secondary | ICD-10-CM

## 2022-07-08 ENCOUNTER — Ambulatory Visit: Payer: Self-pay | Admitting: Surgery

## 2022-09-02 ENCOUNTER — Ambulatory Visit
Admission: RE | Admit: 2022-09-02 | Discharge: 2022-09-02 | Disposition: A | Discharge status: Home / Self Care | Payer: Self-pay | Source: Ambulatory Visit | Attending: Pediatrics | Admitting: Pediatrics

## 2022-09-02 DIAGNOSIS — N6320 Unspecified lump in the left breast, unspecified quadrant: Secondary | ICD-10-CM | POA: Insufficient documentation

## 2023-07-25 ENCOUNTER — Other Ambulatory Visit: Payer: Self-pay | Admitting: Obstetrics and Gynecology

## 2023-07-25 DIAGNOSIS — Z1231 Encounter for screening mammogram for malignant neoplasm of breast: Secondary | ICD-10-CM

## 2023-07-25 DIAGNOSIS — N6001 Solitary cyst of right breast: Secondary | ICD-10-CM

## 2023-09-03 IMAGING — MG MM DIGITAL DIAGNOSTIC UNILAT*L* W/ TOMO W/ CAD
4 series · 4 of 12 positions shown · non-contrast
Comparison: Prior films

CLINICAL DATA: Short-term follow-up left breast mass

EXAM:
DIGITAL DIAGNOSTIC UNILATERAL LEFT MAMMOGRAM WITH TOMOSYNTHESIS AND
CAD
TECHNIQUE: Left digital diagnostic mammography and breast tomosynthesis was
performed. The images were evaluated with computer-aided detection.

[L MLO synth-2D]
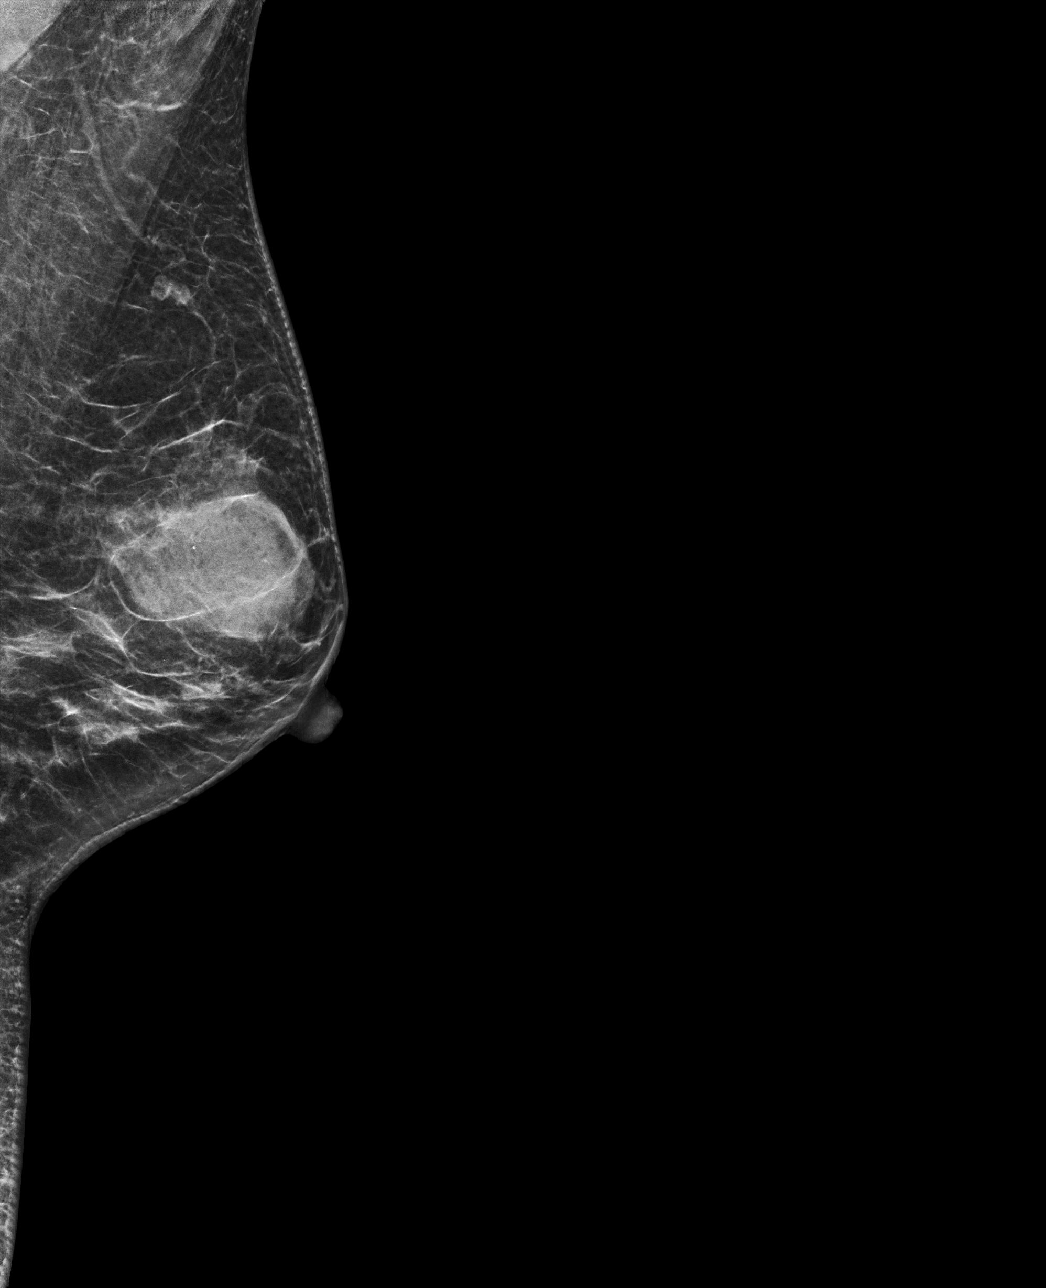

[L CC synth-2D]
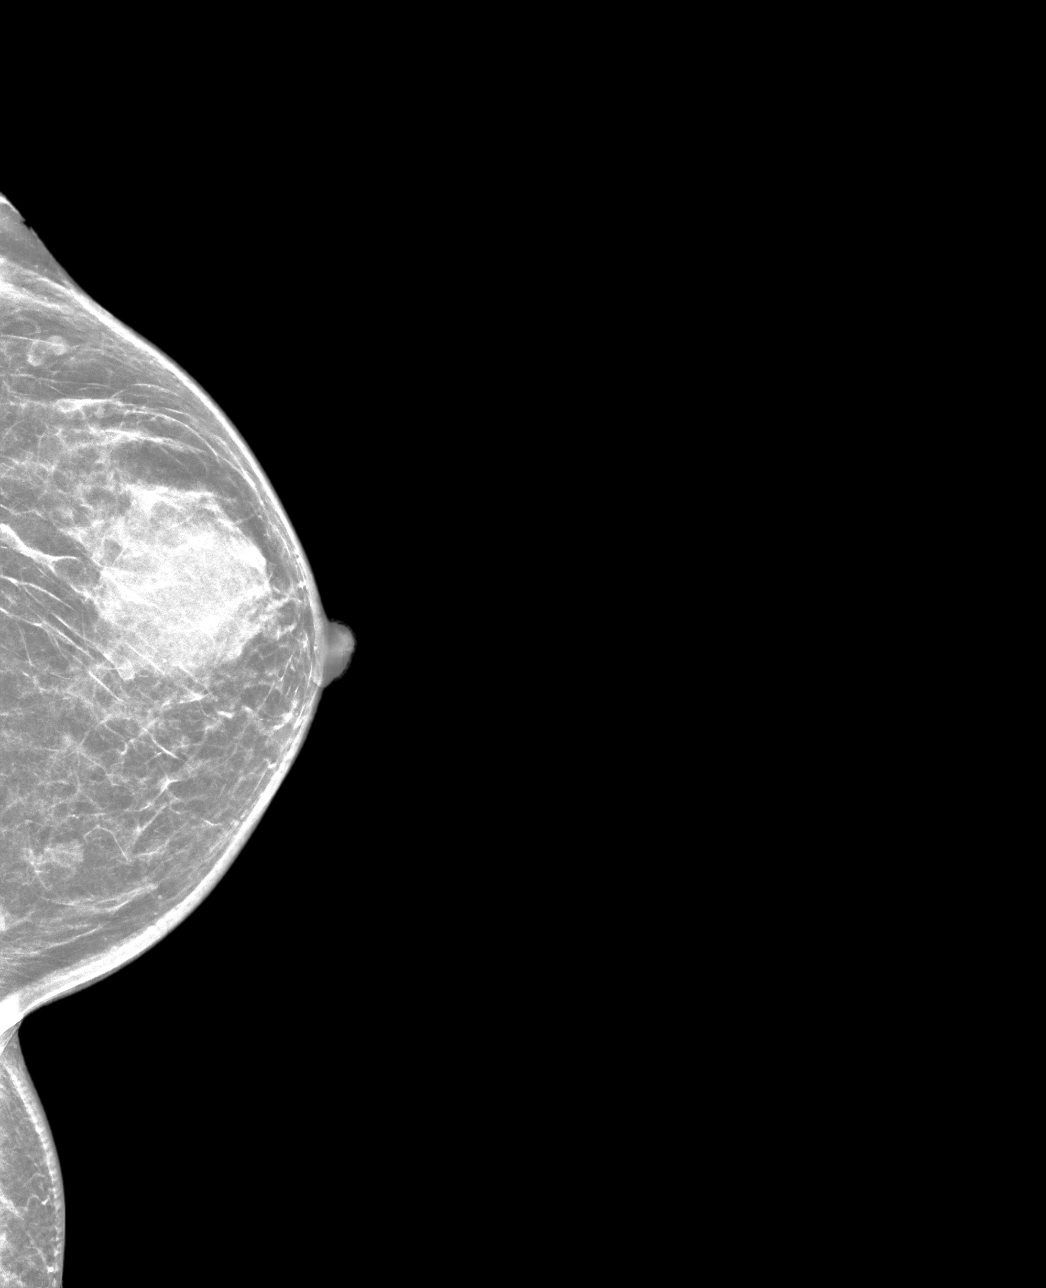

[L CC tomo · tomo slice 31/60.0]
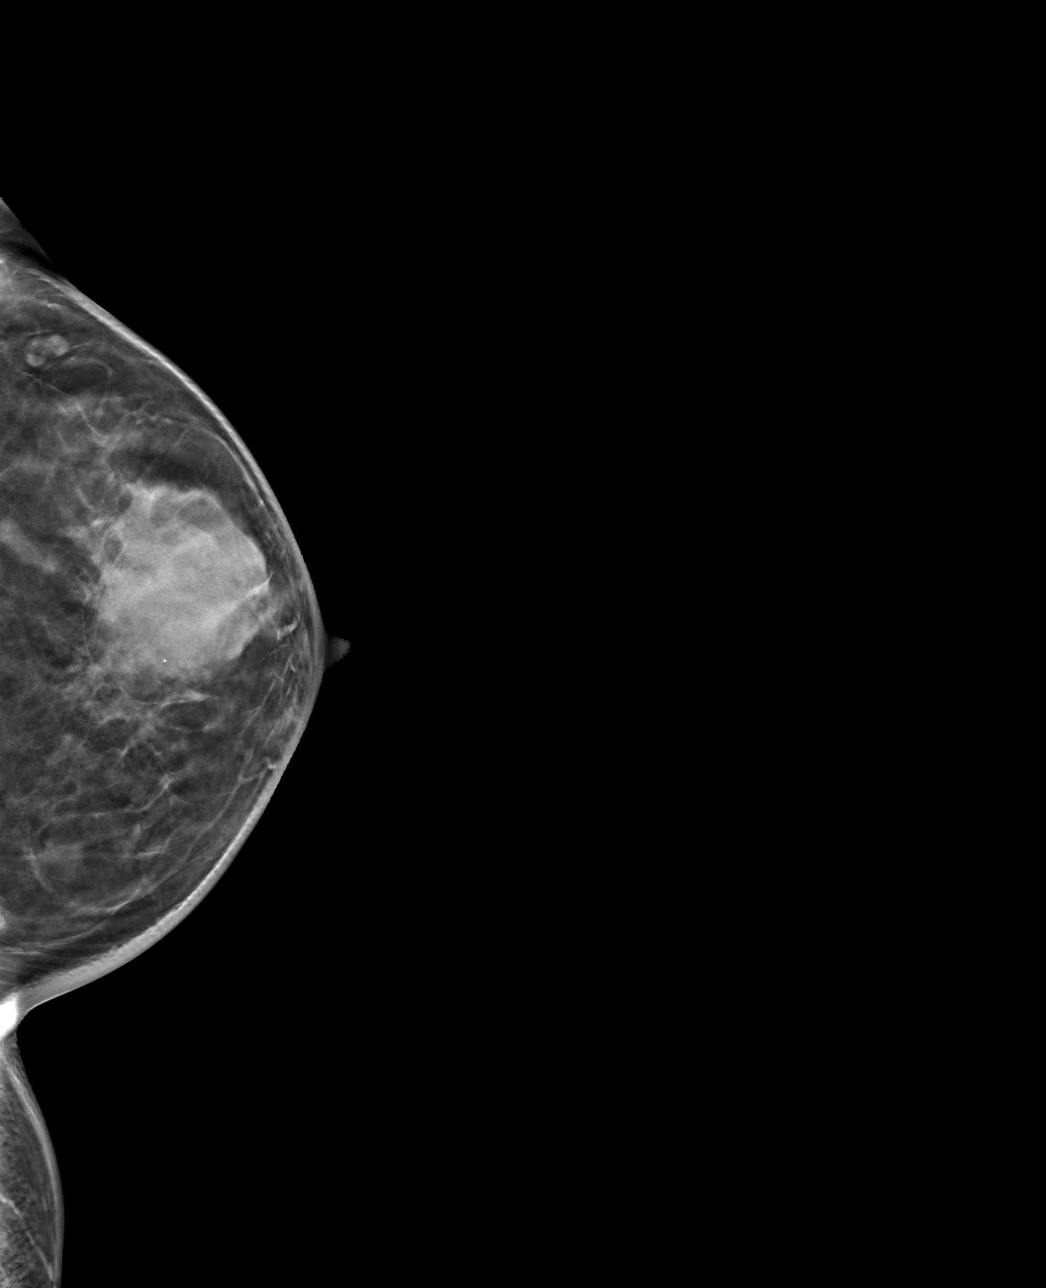

[L MLO tomo · tomo slice 31/60.0]
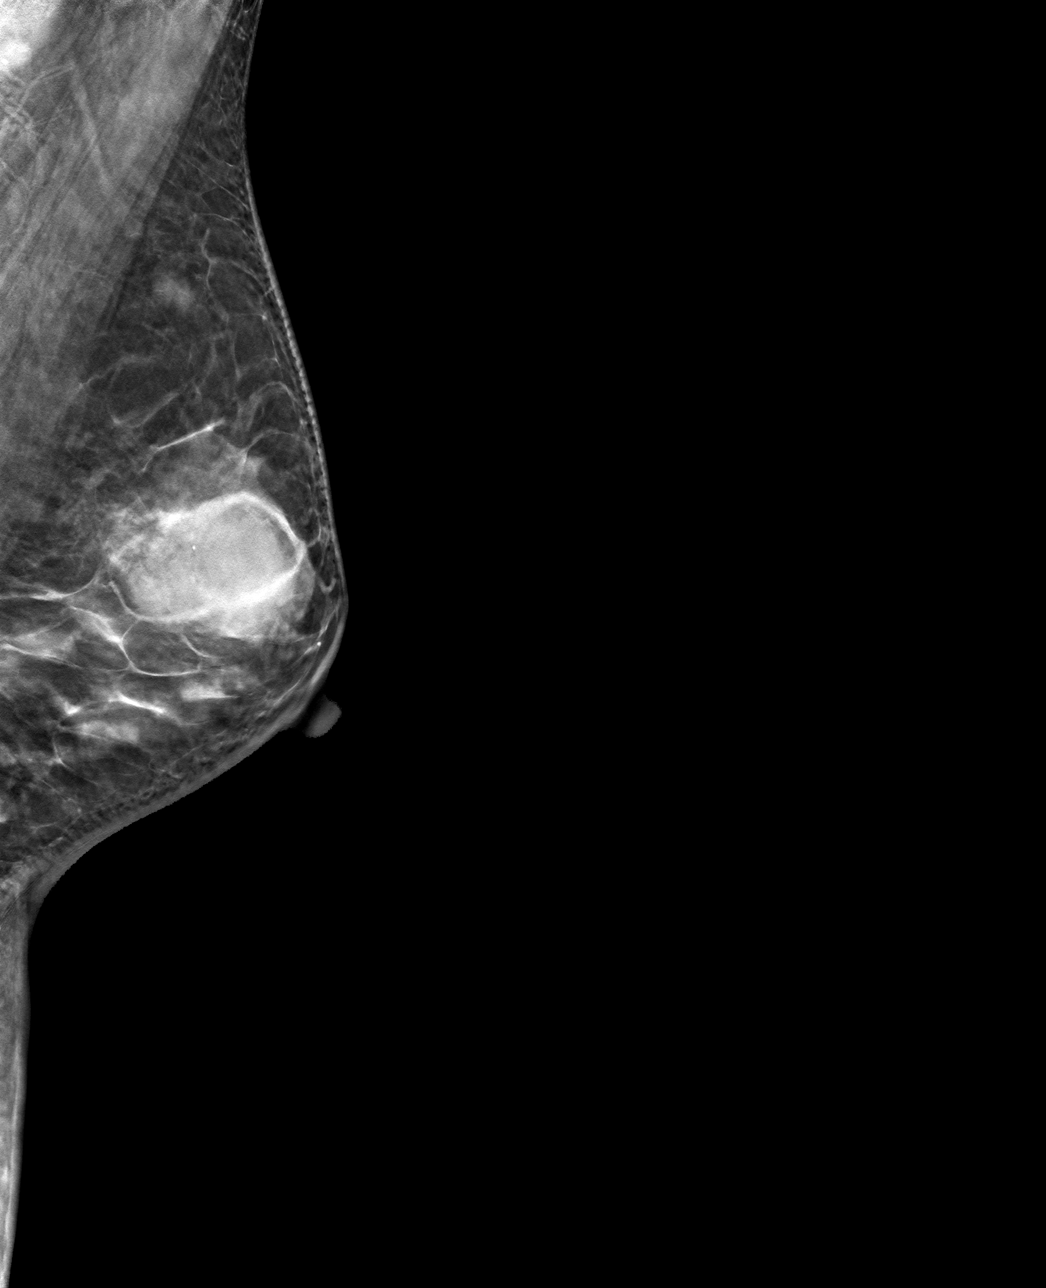

[4 of 12 positions shown; findings below may reference images not displayed]

ACR Breast Density Category c: The breast tissue is heterogeneously
dense, which may obscure small masses.
FINDINGS: Cc and MLO views of the left breast are submitted. The previously
noted asymmetry in the posteromedial left breast is unchanged.
IMPRESSION: Probable benign findings.

RECOMMENDATION:
Bilateral diagnostic mammogram in six-months.

I have discussed the findings and recommendations with the patient.
If applicable, a reminder letter will be sent to the patient
regarding the next appointment.

BI-RADS CATEGORY  3: Probably benign.

## 2023-09-08 ENCOUNTER — Ambulatory Visit
Admission: RE | Admit: 2023-09-08 | Discharge: 2023-09-08 | Disposition: A | Discharge status: Home / Self Care | Payer: Self-pay | Source: Ambulatory Visit | Attending: Obstetrics and Gynecology | Admitting: Obstetrics and Gynecology

## 2023-09-08 ENCOUNTER — Encounter: Payer: Self-pay | Admitting: Radiology

## 2023-09-08 DIAGNOSIS — Z1231 Encounter for screening mammogram for malignant neoplasm of breast: Secondary | ICD-10-CM | POA: Insufficient documentation

## 2023-09-08 DIAGNOSIS — N6002 Solitary cyst of left breast: Secondary | ICD-10-CM | POA: Insufficient documentation

## 2023-09-08 DIAGNOSIS — N6001 Solitary cyst of right breast: Secondary | ICD-10-CM | POA: Insufficient documentation

## 2024-10-08 ENCOUNTER — Other Ambulatory Visit: Payer: Self-pay | Admitting: Pediatrics

## 2024-10-08 DIAGNOSIS — Z1231 Encounter for screening mammogram for malignant neoplasm of breast: Secondary | ICD-10-CM

## 2024-11-11 ENCOUNTER — Ambulatory Visit
Admission: RE | Admit: 2024-11-11 | Discharge: 2024-11-11 | Payer: Self-pay | Attending: Pediatrics | Admitting: Pediatrics

## 2024-11-11 DIAGNOSIS — Z1231 Encounter for screening mammogram for malignant neoplasm of breast: Secondary | ICD-10-CM | POA: Insufficient documentation
